# Patient Record
Sex: Male | Born: 1995 | Hispanic: Yes | Marital: Single | Smoking: Former smoker
Health system: Southern US, Community
[De-identification: ages and names within clinical notes are randomized; demographics above are authoritative.]

## PROBLEM LIST (undated history)

## (undated) ENCOUNTER — Ambulatory Visit (HOSPITAL_COMMUNITY): Admission: EM

## (undated) ENCOUNTER — Ambulatory Visit (HOSPITAL_COMMUNITY): Admission: EM | Payer: Self-pay | Source: Home / Self Care

---
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
## 2020-03-30 ENCOUNTER — Ambulatory Visit: Payer: Self-pay

## 2020-04-09 ENCOUNTER — Ambulatory Visit: Payer: 59 | Admitting: Physician Assistant

## 2020-04-09 ENCOUNTER — Other Ambulatory Visit: Payer: Self-pay

## 2020-04-09 DIAGNOSIS — Z113 Encounter for screening for infections with a predominantly sexual mode of transmission: Secondary | ICD-10-CM

## 2020-04-09 LAB — GRAM STAIN

## 2020-04-10 ENCOUNTER — Encounter: Payer: Self-pay | Admitting: Physician Assistant

## 2020-04-10 NOTE — Progress Notes (Signed)
   [facility] Department STI clinic/screening visit  Subjective:  Javier Carrillo is a 24 y.o. male being seen today for an STI screening visit. The patient reports they do not have symptoms.    Patient has the following medical conditions:  There are no problems to display for this patient.    Chief Complaint  Patient presents with  . STD screen  . SEXUALLY TRANSMITTED DISEASE    screening    HPI  Patient reports that he is not having any symptoms but would like a screening today.  Denies chronic conditions, surgeries and regular medicines.  States had his last HIV test 2 weeks ago at [facility] and his last void prior to sample collection for Gram stain was over 2 hr ago.   See flowsheet for further details and programmatic requirements.    The following portions of the patient's history were reviewed and updated as appropriate: allergies, current medications, past medical history, past social history, past surgical history and problem list.  Objective:  There were no vitals filed for this visit.  Physical Exam Constitutional:      General: He is not in acute distress.    Appearance: Normal appearance.  HENT:     Head: Normocephalic and atraumatic.     Comments: No nits,lice, or hair loss. No cervical, supraclavicular or axillary adenopathy.    Mouth/Throat:     Mouth: Mucous membranes are moist.     Pharynx: Oropharynx is clear. No oropharyngeal exudate or posterior oropharyngeal erythema.  Eyes:     Conjunctiva/sclera: Conjunctivae normal.  Pulmonary:     Effort: Pulmonary effort is normal.  Abdominal:     Palpations: Abdomen is soft. There is no mass.     Tenderness: There is no abdominal tenderness. There is no guarding or rebound.  Genitourinary:    Penis: Normal.      Testes: Normal.     Comments: Pubic area without nits, lice, hair loss, edema, erythema, lesions and inguinal adenopathy. Penis circumcised without rash, lesions and discharge  at meatus. Musculoskeletal:     Cervical back: Neck supple. No tenderness.  Skin:    General: Skin is warm and dry.     Findings: No bruising, erythema, lesion or rash.  Neurological:     Mental Status: He is alert and oriented to person, place, and time.  Psychiatric:        Mood and Affect: Mood normal.        Behavior: Behavior normal.        Thought Content: Thought content normal.        Judgment: Judgment normal.       Assessment and Plan:  Javier Carrillo is a 24 y.o. male presenting to the [facility] Department for STI screening  1. Screening for STD (sexually transmitted disease) Patient into clinic without symptoms. Reviewed with patient that Gram stain is normal and no treatment is indicated today. Rec condoms with all sex. Await test results.  Counseled that RN will call if needs to RTC for treatment once results are back. - Gram stain - Gonococcus culture - HBV Antigen/Antibody [facility] - HIV/HCV [place] [facility] - Syphilis Serology, [facility]     No follow-ups on file.  No future appointments.  Matt Holmes, PA

## 2020-04-13 LAB — GONOCOCCUS CULTURE

## 2020-04-14 LAB — HM HIV SCREENING LAB: HM HIV Screening: NEGATIVE

## 2020-04-14 LAB — HM HEPATITIS C SCREENING LAB: HM Hepatitis Screen: NEGATIVE

## 2020-04-20 LAB — HEPATITIS B SURFACE ANTIGEN

## 2023-10-02 ENCOUNTER — Ambulatory Visit (HOSPITAL_COMMUNITY)
Admission: EM | Admit: 2023-10-02 | Discharge: 2023-10-02 | Disposition: A | Discharge status: Home / Self Care | Attending: Emergency Medicine | Admitting: Emergency Medicine

## 2023-10-02 ENCOUNTER — Encounter (HOSPITAL_COMMUNITY): Payer: Self-pay

## 2023-10-02 DIAGNOSIS — Z113 Encounter for screening for infections with a predominantly sexual mode of transmission: Secondary | ICD-10-CM | POA: Diagnosis not present

## 2023-10-02 LAB — HIV ANTIBODY (ROUTINE TESTING W REFLEX): HIV Screen 4th Generation wRfx: NONREACTIVE

## 2023-10-02 NOTE — Discharge Instructions (Signed)
 Check my chart for results. Avoid sexual activity until results,treatment known and completed. Safe sex with all future sexual activity. We have sent testing for sexually transmitted infections. We will notify you of any positive results once they are received. If required, we will prescribe any medications you might need.  Will not be contacted if your results are negative.      Follow up with PCP. Return as needed.

## 2023-10-02 NOTE — ED Provider Notes (Signed)
 [facility]    CSN: [id number] Arrival date & time: 10/02/23  1256      History   Chief Complaint No chief complaint on file.   HPI Shoji Pertuit is a 28 y.o. male.   28 year old male pt, Allison Collums, presents to urgent care for routine STD testing including blood work.  Patient denies any symptoms  The history is provided by the patient. No language interpreter was used.    History reviewed. No pertinent past medical history.  Patient Active Problem List   Diagnosis Date Noted   Routine screening for STI (sexually transmitted infection) 10/02/2023    History reviewed. No pertinent surgical history.     Home Medications    Prior to Admission medications   Not on File    Family History Family History  Problem Relation Age of Onset   Healthy Mother    Healthy Father     Social History Social History   Tobacco Use   Smoking status: Some Days    Types: E-cigarettes   Smokeless tobacco: Never  Vaping Use   Vaping status: Some Days   Substances: Nicotine, Flavoring  Substance Use Topics   Alcohol use: Yes    Comment: once a week   Drug use: Not Currently    Types: Marijuana    Comment: last use in 2020     Allergies   Patient has no known allergies.   Review of Systems Review of Systems  Constitutional:  Negative for fever.  Gastrointestinal:  Negative for abdominal pain.  Genitourinary:  Negative for dysuria, genital sores and penile discharge.  All other systems reviewed and are negative.    Physical Exam Triage Vital Signs ED Triage Vitals  Encounter Vitals Group     BP      Systolic BP Percentile      Diastolic BP Percentile      Pulse      Resp      Temp      Temp src      SpO2      Weight      Height      Head Circumference      Peak Flow      Pain Score      Pain Loc      Pain Education      Exclude from Growth Chart    No data found.  Updated Vital Signs BP 113/78 (BP Location: Right Arm)    Pulse 67   Temp 98.2 F (36.8 C) (Oral)   Resp 14   SpO2 97%   Visual Acuity Right Eye Distance:   Left Eye Distance:   Bilateral Distance:    Right Eye Near:   Left Eye Near:    Bilateral Near:     Physical Exam Vitals and nursing note reviewed.  Genitourinary:    Comments: Self swabbed, exam deferred Neurological:     General: No focal deficit present.     Mental Status: He is alert and oriented to person, place, and time.     GCS: GCS eye subscore is 4. GCS verbal subscore is 5. GCS motor subscore is 6.     Cranial Nerves: No cranial nerve deficit.     Sensory: No sensory deficit.  Psychiatric:        Attention and Perception: Attention normal.        Mood and Affect: Mood normal.        Speech: Speech normal.  Behavior: Behavior normal.      UC Treatments / Results  Labs (all labs ordered are listed, but only abnormal results are displayed) Labs Reviewed  HIV ANTIBODY (ROUTINE TESTING W REFLEX)  RPR  CYTOLOGY, (ORAL, ANAL, URETHRAL) ANCILLARY ONLY    EKG   Radiology No results found.  Procedures Procedures (including critical care time)  Medications Ordered in UC Medications - No data to display  Initial Impression / Assessment and Plan / UC Course  I have reviewed the triage vital signs and the nursing notes.  Pertinent labs & imaging results that were available during my care of the patient were reviewed by me and considered in my medical decision making (see chart for details).  Clinical Course as of 10/02/23 1424  Tue Oct 02, 2023  1401 Patient requesting HIV, RPR blood work as well as routine STI screening [JD]    Clinical Course User Index [JD] Delene Morais, Eveleen Hinds, NP   Discussed exam findings and plan of care with patient, strict go to ER precautions given.   Patient verbalized understanding to this provider.  Ddx: Routine STI testing Final Clinical Impressions(s) / UC Diagnoses   Final diagnoses:  Routine screening for STI  (sexually transmitted infection)     Discharge Instructions      Check my chart for results. Avoid sexual activity until results,treatment known and completed. Safe sex with all future sexual activity. We have sent testing for sexually transmitted infections. We will notify you of any positive results once they are received. If required, we will prescribe any medications you might need.  Will not be contacted if your results are negative.      Follow up with PCP. Return as needed.   ED Prescriptions   None    PDMP not reviewed this encounter.   Peter Brands, NP 10/02/23 1425

## 2023-10-02 NOTE — ED Triage Notes (Signed)
 Patient is requesting STD testing and blood work. Paient denies any symptoms.

## 2023-10-03 LAB — CYTOLOGY, (ORAL, ANAL, URETHRAL) ANCILLARY ONLY
Chlamydia: NEGATIVE
Comment: NEGATIVE
Comment: NEGATIVE
Comment: NORMAL
Neisseria Gonorrhea: NEGATIVE
Trichomonas: NEGATIVE

## 2023-10-03 LAB — RPR: RPR Ser Ql: NONREACTIVE

## 2024-02-25 ENCOUNTER — Encounter (HOSPITAL_COMMUNITY): Payer: Self-pay

## 2024-02-25 ENCOUNTER — Ambulatory Visit (HOSPITAL_COMMUNITY)
Admission: EM | Admit: 2024-02-25 | Discharge: 2024-02-25 | Disposition: A | Discharge status: Home / Self Care | Attending: Family Medicine | Admitting: Family Medicine

## 2024-02-25 VITALS — BP 121/79 | HR 67 | Temp 98.2°F | Resp 18 | Ht 66.0 in | Wt 151.0 lb

## 2024-02-25 DIAGNOSIS — Z113 Encounter for screening for infections with a predominantly sexual mode of transmission: Secondary | ICD-10-CM | POA: Diagnosis present

## 2024-02-25 LAB — HIV ANTIBODY (ROUTINE TESTING W REFLEX): HIV Screen 4th Generation wRfx: NONREACTIVE

## 2024-02-25 NOTE — Discharge Instructions (Addendum)
 The clinic will contact you with results of the STD testing done today if positive.  Follow-up as needed.

## 2024-02-25 NOTE — ED Provider Notes (Signed)
 [facility]    CSN: [id number] Arrival date & time: 02/25/24  1519      History   Chief Complaint Chief Complaint  Patient presents with   Exposure to STI    HPI Javier Carrillo is a 28 y.o. male presents for STD testing.  Patient currently denies any symptoms including penile discharge, testicular pain or swelling, dysuria.  No known STD exposure.  No other concerns at this time.  HPI  History reviewed. No pertinent past medical history.  Patient Active Problem List   Diagnosis Date Noted   Routine screening for STI (sexually transmitted infection) 10/02/2023    History reviewed. No pertinent surgical history.     Home Medications    Prior to Admission medications   Not on File    Family History Family History  Problem Relation Age of Onset   Healthy Mother    Healthy Father     Social History Social History   Tobacco Use   Smoking status: Former    Types: E-cigarettes   Smokeless tobacco: Never  Vaping Use   Vaping status: Some Days   Substances: Nicotine, Flavoring  Substance Use Topics   Alcohol use: Yes    Comment: once a week   Drug use: Not Currently    Types: Marijuana    Comment: last use in 2020     Allergies   Patient has no known allergies.   Review of Systems Review of Systems  Genitourinary:        STD testing     Physical Exam Triage Vital Signs ED Triage Vitals  Encounter Vitals Group     BP 02/25/24 1547 121/79     Girls Systolic BP Percentile --      Girls Diastolic BP Percentile --      Boys Systolic BP Percentile --      Boys Diastolic BP Percentile --      Pulse Rate 02/25/24 1547 67     Resp 02/25/24 1547 18     Temp 02/25/24 1547 98.2 F (36.8 C)     Temp Source 02/25/24 1547 Oral     SpO2 02/25/24 1547 97 %     Weight 02/25/24 1546 151 lb (68.5 kg)     Height 02/25/24 1546 5' 6 (1.676 m)     Head Circumference --      Peak Flow --      Pain Score 02/25/24 1546 0     Pain Loc --       Pain Education --      Exclude from Growth Chart --    No data found.  Updated Vital Signs BP 121/79 (BP Location: Right Arm)   Pulse 67   Temp 98.2 F (36.8 C) (Oral)   Resp 18   Ht 5' 6 (1.676 m)   Wt 151 lb (68.5 kg)   SpO2 97%   BMI 24.37 kg/m   Visual Acuity Right Eye Distance:   Left Eye Distance:   Bilateral Distance:    Right Eye Near:   Left Eye Near:    Bilateral Near:     Physical Exam Vitals and nursing note reviewed.  Constitutional:      Appearance: Normal appearance.  HENT:     Head: Normocephalic and atraumatic.  Eyes:     Pupils: Pupils are equal, round, and reactive to light.  Cardiovascular:     Rate and Rhythm: Normal rate.  Pulmonary:     Effort: Pulmonary effort is  normal.  Skin:    General: Skin is warm and dry.  Neurological:     General: No focal deficit present.     Mental Status: He is alert and oriented to person, place, and time.  Psychiatric:        Mood and Affect: Mood normal.        Behavior: Behavior normal.      UC Treatments / Results  Labs (all labs ordered are listed, but only abnormal results are displayed) Labs Reviewed  HIV ANTIBODY (ROUTINE TESTING W REFLEX)  RPR  CYTOLOGY, (ORAL, ANAL, URETHRAL) ANCILLARY ONLY    EKG   Radiology No results found.  Procedures Procedures (including critical care time)  Medications Ordered in UC Medications - No data to display  Initial Impression / Assessment and Plan / UC Course  I have reviewed the triage vital signs and the nursing notes.  Pertinent labs & imaging results that were available during my care of the patient were reviewed by me and considered in my medical decision making (see chart for details).     STD testing is ordered and will contact for any positive results.  Patient to follow-up as needed. Final Clinical Impressions(s) / UC Diagnoses   Final diagnoses:  Screening examination for STD (sexually transmitted disease)     Discharge  Instructions      The clinic will contact you with results of the STD testing done today if positive.  Follow-up as needed.   ED Prescriptions   None    PDMP not reviewed this encounter.   Loreda Myla SAUNDERS, NP 02/25/24 [phone]

## 2024-02-25 NOTE — ED Triage Notes (Signed)
 Patient requesting STI testing, denies any sx's.  Would like bloodwork as well.

## 2024-02-26 LAB — CYTOLOGY, (ORAL, ANAL, URETHRAL) ANCILLARY ONLY
Chlamydia: NEGATIVE
Comment: NEGATIVE
Comment: NEGATIVE
Comment: NORMAL
Neisseria Gonorrhea: NEGATIVE
Trichomonas: NEGATIVE

## 2024-02-26 LAB — RPR: RPR Ser Ql: NONREACTIVE
